# Patient Record
Sex: Male | Born: 1975 | Race: White | Hispanic: No | Marital: Single
Health system: Southern US, Community
[De-identification: ages and names within clinical notes are randomized; demographics above are authoritative.]

---

## 2020-07-08 ENCOUNTER — Emergency Department
Admission: EM | Admit: 2020-07-08 | Discharge: 2020-07-08 | Disposition: A | Discharge status: Home / Self Care | Payer: Self-pay | Attending: Emergency Medicine | Admitting: Emergency Medicine

## 2020-07-08 ENCOUNTER — Other Ambulatory Visit: Payer: Self-pay

## 2020-07-08 ENCOUNTER — Emergency Department: Payer: Self-pay

## 2020-07-08 DIAGNOSIS — M546 Pain in thoracic spine: Secondary | ICD-10-CM | POA: Insufficient documentation

## 2020-07-08 DIAGNOSIS — M545 Low back pain, unspecified: Secondary | ICD-10-CM | POA: Insufficient documentation

## 2020-07-08 DIAGNOSIS — M542 Cervicalgia: Secondary | ICD-10-CM | POA: Insufficient documentation

## 2020-07-08 DIAGNOSIS — Y92410 Unspecified street and highway as the place of occurrence of the external cause: Secondary | ICD-10-CM | POA: Insufficient documentation

## 2020-07-08 MED ORDER — METHOCARBAMOL 500 MG PO TABS: 500.0000 mg | ORAL_TABLET | Freq: Three times a day (TID) | ORAL | 0 refills | Status: AC | PRN

## 2020-07-08 MED ORDER — MELOXICAM 15 MG PO TABS: 15.0000 mg | ORAL_TABLET | Freq: Every day | ORAL | 2 refills | Status: AC
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
## 2020-07-08 NOTE — ED Notes (Signed)
Provided DC instructions. Verbalized understanding.  
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
## 2020-07-08 NOTE — Discharge Instructions (Signed)
Take Meloxicam and Robaxin as directed.  
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
